# Patient Record
Sex: Male | Born: 1960 | Race: Asian | Hispanic: No | Marital: Married | Smoking: Never smoker
Health system: Southern US, Community
[De-identification: ages and names within clinical notes are randomized; demographics above are authoritative.]

---

## 2019-05-12 ENCOUNTER — Ambulatory Visit (INDEPENDENT_AMBULATORY_CARE_PROVIDER_SITE_OTHER): Payer: BC Managed Care – PPO | Admitting: Family Medicine

## 2019-05-12 ENCOUNTER — Encounter: Payer: Self-pay | Admitting: Family Medicine

## 2019-05-12 ENCOUNTER — Ambulatory Visit: Payer: Self-pay

## 2019-05-12 ENCOUNTER — Other Ambulatory Visit: Payer: Self-pay

## 2019-05-12 VITALS — BP 116/78 | HR 89 | Ht 71.0 in | Wt 192.0 lb

## 2019-05-12 DIAGNOSIS — M25511 Pain in right shoulder: Secondary | ICD-10-CM | POA: Diagnosis not present

## 2019-05-12 DIAGNOSIS — G8929 Other chronic pain: Secondary | ICD-10-CM | POA: Insufficient documentation

## 2019-05-12 DIAGNOSIS — M25512 Pain in left shoulder: Secondary | ICD-10-CM

## 2019-05-12 NOTE — Progress Notes (Signed)
Blake Mcdowell - 58 y.o. male MRN [id number]  Date of birth: 1961/09/22  SUBJECTIVE:  Including CC & ROS.  Chief Complaint  Patient presents with  . Shoulder Pain    right shoulder    Blake Mcdowell is a 58 y.o. male that is presenting with bilateral shoulder pain.  The pain is generalized over the left and right shoulder.  It is localized to the shoulder joint itself.  He feels the pain with certain movements such as lifting above his head.  He works at [employer].  He reports having improvement with the dexamethasone.  He denies any specific inciting event or trauma.  The pain is occurring on a daily basis now.  It has been occurring since January.  He reports the pain is moderate to severe.  Denies any history of surgery.  There is no radicular symptoms.  The pain can be sharp and stabbing.  Here denies a history of gout.  Review of his office note from 5/21 shows a shoulder x-ray on the right that a was read as normal.  He was prescribed dexamethasone.   Review of Systems  Constitutional: Negative for fever.  HENT: Negative for congestion.   Respiratory: Negative for cough.   Cardiovascular: Negative for chest pain.  Gastrointestinal: Negative for abdominal pain.  Musculoskeletal: Positive for arthralgias.  Skin: Negative for color change.  Neurological: Negative for weakness.  Hematological: Negative for adenopathy.    HISTORY: Past Medical, Surgical, Social, and Family History Reviewed & Updated per EMR.   Pertinent Historical Findings include:  History reviewed. No pertinent past medical history.  History reviewed. No pertinent surgical history.  Allergies  Allergen Reactions  . Aspirin     History reviewed. No pertinent family history.   Social History   Socioeconomic History  . Marital status: Married    Spouse name: Not on file  . Number of children: Not on file  . Years of education: Not on file  . Highest education level: Not on file  Occupational History  . Not on file   Social Needs  . Financial resource strain: Not on file  . Food insecurity:    Worry: Not on file    Inability: Not on file  . Transportation needs:    Medical: Not on file    Non-medical: Not on file  Tobacco Use  . Smoking status: Never Smoker  . Smokeless tobacco: Never Used  Substance and Sexual Activity  . Alcohol use: Not on file  . Drug use: Not on file  . Sexual activity: Not on file  Lifestyle  . Physical activity:    Days per week: Not on file    Minutes per session: Not on file  . Stress: Not on file  Relationships  . Social connections:    Talks on phone: Not on file    Gets together: Not on file    Attends religious service: Not on file    Active member of club or organization: Not on file    Attends meetings of clubs or organizations: Not on file    Relationship status: Not on file  . Intimate partner violence:    Fear of current or ex partner: Not on file    Emotionally abused: Not on file    Physically abused: Not on file    Forced sexual activity: Not on file  Other Topics Concern  . Not on file  Social History Narrative  . Not on file     PHYSICAL  EXAM:  VS: BP 116/78   Pulse 89   Ht 5\' 11"  (1.803 m)   Wt 192 lb (87.1 kg)   BMI 26.78 kg/m  Physical Exam Gen: NAD, alert, cooperative with exam, well-appearing ENT: normal lips, normal nasal mucosa,  Eye: normal EOM, normal conjunctiva and lids CV:  no edema, +2 pedal pulses   Resp: no accessory muscle use, non-labored,  Skin: no rashes, no areas of induration  Neuro: normal tone, normal sensation to touch Psych:  normal insight, alert and oriented MSK:  Right and left Shoulder: Inspection reveals no abnormalities, atrophy or asymmetry. Palpation is normal with no tenderness over AC joint Normal ER  Pain with abduction and ER  Rotator cuff strength normal throughout. Mild pain with Hawkin's tests, empty can sign. Normal scapular function observed. Neurovascularly intact   Limited  ultrasound: Right and left shoulder:  Right shoulder:  Encircling effusion of the biceps tendon in the trans-and long axis. Normal-appearing subscapularis. Normal-appearing supraspinatus. There appears to be some crystalline deposition within the posterior glenohumeral joint.  Left shoulder: Encircling effusion of the biceps tendon in long axis with hyperechoic lucencies to suggest gout.   Summary: Findings are suggestive of gouty change with an effusion of the biceps tendon bilaterally.  Ultrasound and interpretation by Clare Gandy, MD          ASSESSMENT & PLAN:   No problem-specific Assessment & Plan notes found for this encounter.

## 2019-05-12 NOTE — Assessment & Plan Note (Signed)
It appears that he has a gout flare in each shoulder.  There was an encircling effusion around the biceps tendon in the left and right shoulder.  There appears to be hyperechoic crystals seen within this effusion.  X-ray from his primary care was reported as normal in the right shoulder. - prednisone  - f/u in 2-3 weeks. Would obtain uric acid

## 2019-05-12 NOTE — Patient Instructions (Signed)
Nice to meet you Please try over the counter voltaren gel  Please try ice on the shoulder  Please try the exercises   Please send me a message in MyChart with any questions or updates.  Please see me back in 4 weeks.   --Dr. Jordan Likes

## 2019-06-13 ENCOUNTER — Ambulatory Visit: Payer: BC Managed Care – PPO | Admitting: Family Medicine

## 2019-06-14 ENCOUNTER — Ambulatory Visit (INDEPENDENT_AMBULATORY_CARE_PROVIDER_SITE_OTHER): Payer: BC Managed Care – PPO | Admitting: Family Medicine

## 2019-06-14 ENCOUNTER — Other Ambulatory Visit: Payer: Self-pay

## 2019-06-14 ENCOUNTER — Encounter: Payer: Self-pay | Admitting: Family Medicine

## 2019-06-14 VITALS — BP 109/77 | HR 97 | Ht 71.0 in | Wt 200.0 lb

## 2019-06-14 DIAGNOSIS — G8929 Other chronic pain: Secondary | ICD-10-CM | POA: Diagnosis not present

## 2019-06-14 DIAGNOSIS — M25511 Pain in right shoulder: Secondary | ICD-10-CM

## 2019-06-14 DIAGNOSIS — M25512 Pain in left shoulder: Secondary | ICD-10-CM

## 2019-06-14 MED ORDER — COLCHICINE 0.6 MG PO TABS: 0.6000 mg | ORAL_TABLET | Freq: Two times a day (BID) | ORAL | 2 refills | Status: AC

## 2019-06-14 NOTE — Patient Instructions (Signed)
Good to see you Please take the colchicine 2 times daily until you have no pain in your shoulders for 2 days. I will call you with the lab results from today.  We may need to add another medication. Please send me a message in MyChart with any questions or updates.  Please see me back in 4 weeks.   --Dr. Raeford Razor

## 2019-06-14 NOTE — Progress Notes (Signed)
Blake Mcdowell - 58 y.o. male MRN [id number]  Date of birth: February 12, 1961  SUBJECTIVE:  Including CC & ROS.  Chief Complaint  Patient presents with  . Follow-up    follow up for right shoulder    Blake Mcdowell is a 58 y.o. male that is presenting with acute worsening of bilateral shoulder pain.  Of the right is worse than the left.  He was prescribed prednisone and had improvement of his symptoms.  Prior to that he was prescribed dexamethasone.  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  The pain is worse with abduction and overhead movements.  He feels like there is popping in the shoulder.  He feels that there is some pain to the neck.  It is sharp and stabbing.  The pain is moderate to severe.  It is more constant in nature.   Review of Systems  Constitutional: Negative for fever.  HENT: Negative for congestion.   Respiratory: Negative for cough.   Cardiovascular: Negative for chest pain.  Gastrointestinal: Negative for abdominal pain.  Musculoskeletal: Positive for joint swelling.  Skin: Negative for color change.  Neurological: Negative for weakness.  Hematological: Negative for adenopathy.    HISTORY: Past Medical, Surgical, Social, and Family History Reviewed & Updated per EMR.   Pertinent Historical Findings include:  No past medical history on file.  No past surgical history on file.  Allergies  Allergen Reactions  . Aspirin     No family history on file.   Social History   Socioeconomic History  . Marital status: Married    Spouse name: Not on file  . Number of children: Not on file  . Years of education: Not on file  . Highest education level: Not on file  Occupational History  . Not on file  Social Needs  . Financial resource strain: Not on file  . Food insecurity    Worry: Not on file    Inability: Not on file  . Transportation needs    Medical: Not on file    Non-medical: Not on file  Tobacco Use  . Smoking status: Never Smoker  .  Smokeless tobacco: Never Used  Substance and Sexual Activity  . Alcohol use: Not on file  . Drug use: Not on file  . Sexual activity: Not on file  Lifestyle  . Physical activity    Days per week: Not on file    Minutes per session: Not on file  . Stress: Not on file  Relationships  . Social Musicianconnections    Talks on phone: Not on file    Gets together: Not on file    Attends religious service: Not on file    Active member of club or organization: Not on file    Attends meetings of clubs or organizations: Not on file    Relationship status: Not on file  . Intimate partner violence    Fear of current or ex partner: Not on file    Emotionally abused: Not on file    Physically abused: Not on file    Forced sexual activity: Not on file  Other Topics Concern  . Not on file  Social History Narrative  . Not on file     PHYSICAL EXAM:  VS: BP 109/77   Pulse 97   Ht 5\' 11"  (1.803 m)   Wt 200 lb (90.7 kg)   BMI 27.89 kg/m  Physical Exam Gen: NAD, alert, cooperative with exam, well-appearing ENT: normal  lips, normal nasal mucosa,  Eye: normal EOM, normal conjunctiva and lids CV:  no edema, +2 pedal pulses   Resp: no accessory muscle use, non-labored,  Skin: no rashes, no areas of induration  Neuro: normal tone, normal sensation to touch Psych:  normal insight, alert and oriented MSK:  Right and left shoulder:  Normal active flexion and abduction  Pain with ER  Normal IR and ER  Positive O'Brien's test. Neurovascular intact     ASSESSMENT & PLAN:   Chronic pain of both shoulders Still having ongoing effusions within the left and right shoulder.  Pain seems more joint oriented.  Still likely related to gout. -Uric acid and CMP. -Colchicine. -If no improvement will consider bilateral injections.  Will consider a rheumatology work-up as well.

## 2019-06-15 LAB — URIC ACID: Uric Acid: 5.7 mg/dL (ref 3.7–8.6)

## 2019-06-15 LAB — COMPREHENSIVE METABOLIC PANEL
ALT: 30 IU/L (ref 0–44)
AST: 19 IU/L (ref 0–40)
Albumin/Globulin Ratio: 1.8 (ref 1.2–2.2)
Albumin: 4.4 g/dL (ref 3.8–4.9)
Alkaline Phosphatase: 61 IU/L (ref 39–117)
BUN/Creatinine Ratio: 27 — ABNORMAL HIGH (ref 9–20)
BUN: 20 mg/dL (ref 6–24)
Bilirubin Total: 0.5 mg/dL (ref 0.0–1.2)
CO2: 20 mmol/L (ref 20–29)
Calcium: 9.4 mg/dL (ref 8.7–10.2)
Chloride: 101 mmol/L (ref 96–106)
Creatinine, Ser: 0.74 mg/dL — ABNORMAL LOW (ref 0.76–1.27)
GFR calc Af Amer: 119 mL/min/{1.73_m2} (ref >59)
GFR calc non Af Amer: 103 mL/min/{1.73_m2} (ref >59)
Globulin, Total: 2.4 g/dL (ref 1.5–4.5)
Glucose: 236 mg/dL — ABNORMAL HIGH (ref 65–99)
Potassium: 4.2 mmol/L (ref 3.5–5.2)
Sodium: 136 mmol/L (ref 134–144)
Total Protein: 6.8 g/dL (ref 6.0–8.5)

## 2019-06-15 NOTE — Assessment & Plan Note (Signed)
Still having ongoing effusions within the left and right shoulder.  Pain seems more joint oriented.  Still likely related to gout. -Uric acid and CMP. -Colchicine. -If no improvement will consider bilateral injections.  Will consider a rheumatology work-up as well.

## 2019-06-16 ENCOUNTER — Telehealth: Payer: Self-pay | Admitting: Family Medicine

## 2019-06-16 NOTE — Telephone Encounter (Signed)
Spoke with patient's daughter about results.   Rosemarie Ax, MD [facility] 06/16/2019, 12:17 PM

## 2019-06-20 ENCOUNTER — Telehealth: Payer: Self-pay | Admitting: Family Medicine

## 2019-06-20 NOTE — Telephone Encounter (Signed)
Patient's called to update that the medication has not helped. She requested a call back.

## 2019-06-21 NOTE — Telephone Encounter (Signed)
Left VM for patient. If they calls back please have them speak with a nurse/CMA and inform we need to obtain further lab work and will need to inject both of his shoulders.   If any questions then please take the best time and phone number to call and I will try to call them back.   Rosemarie Ax, MD [facility] 06/21/2019, 8:52 AM

## 2019-06-22 ENCOUNTER — Other Ambulatory Visit: Payer: Self-pay

## 2019-06-22 ENCOUNTER — Ambulatory Visit: Payer: Self-pay

## 2019-06-22 ENCOUNTER — Ambulatory Visit (INDEPENDENT_AMBULATORY_CARE_PROVIDER_SITE_OTHER): Payer: BC Managed Care – PPO | Admitting: Family Medicine

## 2019-06-22 ENCOUNTER — Encounter: Payer: Self-pay | Admitting: Family Medicine

## 2019-06-22 ENCOUNTER — Ambulatory Visit (HOSPITAL_BASED_OUTPATIENT_CLINIC_OR_DEPARTMENT_OTHER)
Admission: RE | Admit: 2019-06-22 | Discharge: 2019-06-22 | Disposition: A | Discharge status: Home / Self Care | Payer: BC Managed Care – PPO | Source: Ambulatory Visit | Attending: Family Medicine | Admitting: Family Medicine

## 2019-06-22 VITALS — BP 124/76 | HR 83 | Ht 71.0 in | Wt 209.0 lb

## 2019-06-22 DIAGNOSIS — M25511 Pain in right shoulder: Secondary | ICD-10-CM

## 2019-06-22 DIAGNOSIS — M25512 Pain in left shoulder: Secondary | ICD-10-CM

## 2019-06-22 DIAGNOSIS — G8929 Other chronic pain: Secondary | ICD-10-CM | POA: Insufficient documentation

## 2019-06-22 MED ORDER — TRIAMCINOLONE ACETONIDE 40 MG/ML IJ SUSP
40.0000 mg | Freq: Once | INTRAMUSCULAR | Status: AC
  Administered 2019-06-22: 12:00:00 40 mg via INTRA_ARTICULAR

## 2019-06-22 NOTE — Progress Notes (Signed)
Blake Mcdowell - 58 y.o. male MRN [id number]  Date of birth: 12/27/1960  SUBJECTIVE:  Including CC & ROS.  Chief Complaint  Patient presents with  . Follow-up    follow up for bilateral shoulder    Blake Mcdowell is a 58 y.o. male that is presenting for bilateral shoulder pain.  He has not had any improvement with colchicine.  He did have improvement with the steroids.   Review of Systems  HISTORY: Past Medical, Surgical, Social, and Family History Reviewed & Updated per EMR.   Pertinent Historical Findings include:  No past medical history on file.  No past surgical history on file.  Allergies  Allergen Reactions  . Aspirin     No family history on file.   Social History   Socioeconomic History  . Marital status: Married    Spouse name: Not on file  . Number of children: Not on file  . Years of education: Not on file  . Highest education level: Not on file  Occupational History  . Not on file  Social Needs  . Financial resource strain: Not on file  . Food insecurity    Worry: Not on file    Inability: Not on file  . Transportation needs    Medical: Not on file    Non-medical: Not on file  Tobacco Use  . Smoking status: Never Smoker  . Smokeless tobacco: Never Used  Substance and Sexual Activity  . Alcohol use: Not on file  . Drug use: Not on file  . Sexual activity: Not on file  Lifestyle  . Physical activity    Days per week: Not on file    Minutes per session: Not on file  . Stress: Not on file  Relationships  . Social Herbalist on phone: Not on file    Gets together: Not on file    Attends religious service: Not on file    Active member of club or organization: Not on file    Attends meetings of clubs or organizations: Not on file    Relationship status: Not on file  . Intimate partner violence    Fear of current or ex partner: Not on file    Emotionally abused: Not on file    Physically abused: Not on file    Forced sexual activity: Not on file   Other Topics Concern  . Not on file  Social History Narrative  . Not on file     PHYSICAL EXAM:  VS: BP 124/76   Pulse 83   Ht _0  (1.803 m)   Wt 209 lb (94.8 kg)   BMI 29.15 kg/m  Physical Exam Gen: NAD, alert, cooperative with exam, well-appearing   Aspiration/Injection Procedure Note Amadeo Lutterman 09-24-1961  Procedure: Injection Indications: Right shoulder pain  Procedure Details Consent: Risks of procedure as well as the alternatives and risks of each were explained to the (patient/caregiver).  Consent for procedure obtained. Time Out: Verified patient identification, verified procedure, site/side was marked, verified correct patient position, special equipment/implants available, medications/allergies/relevent history reviewed, required imaging and test results available.  Performed.  The area was cleaned with iodine and alcohol swabs.    The right glenohumeral joint was injected using 1 cc's of 40 mg Kenalog and 4 cc's of 0.25% bupivacaine with a 22 3 1/2" needle.  Ultrasound was used. Images were obtained in trans-views showing the injection.     A sterile dressing was applied.  Patient did tolerate procedure  well.   Aspiration/Injection Procedure Note Jacobo Fier [birth year]  Procedure: Injection Indications: Left shoulder pain  Procedure Details Consent: Risks of procedure as well as the alternatives and risks of each were explained to the (patient/caregiver).  Consent for procedure obtained. Time Out: Verified patient identification, verified procedure, site/side was marked, verified correct patient position, special equipment/implants available, medications/allergies/relevent history reviewed, required imaging and test results available.  Performed.  The area was cleaned with iodine and alcohol swabs.    The left glenohumeral joint was injected using 1 cc's of 40 mg Kenalog and 4 cc's of 0.25% bupivacaine with a 22 3 1/2" needle.  Ultrasound was used. Images were  obtained in trans-views showing the injection.     A sterile dressing was applied.  Patient did tolerate procedure well.       ASSESSMENT & PLAN:   Chronic pain of both shoulders Has had improvement of his symptoms with dexamethasone and prednisone.  Did not have any improvement with colchicine.  Does not seem to be related to gout.  May be related to labral pathology or other rheumatologic condition. -Bilateral injections today. -Bilateral x-rays. -If no improvement need to consider ESR, CRP, RA, anti-CCP, ANA, CBC, parvo B19, hep B, hep C

## 2019-06-22 NOTE — Patient Instructions (Signed)
Good to see you I will call you with the results from today  Please try the exercises   Please send me a message in MyChart with any questions or updates.  Please see me back in 4 weeks.   --Dr. Raeford Razor

## 2019-06-22 NOTE — Assessment & Plan Note (Signed)
Has had improvement of his symptoms with dexamethasone and prednisone.  Did not have any improvement with colchicine.  Does not seem to be related to gout.  May be related to labral pathology or other rheumatologic condition. -Bilateral injections today. -Bilateral x-rays. -If no improvement need to consider ESR, CRP, RA, anti-CCP, ANA, CBC, parvo B19, hep B, hep C

## 2019-06-24 ENCOUNTER — Telehealth: Payer: Self-pay | Admitting: Family Medicine

## 2019-06-24 NOTE — Telephone Encounter (Signed)
Unable to leave VM for patient. If he calls back please have him speak with a nurse/CMA and inform that his xrays were normal.   If any questions then please take the best time and phone number to call and I will try to call him back.   Rosemarie Ax, MD [facility] 06/24/2019, 11:34 AM

## 2019-07-11 ENCOUNTER — Ambulatory Visit: Payer: BC Managed Care – PPO | Admitting: Family Medicine

## 2019-07-18 ENCOUNTER — Encounter: Payer: Self-pay | Admitting: Family Medicine

## 2019-07-18 ENCOUNTER — Ambulatory Visit (INDEPENDENT_AMBULATORY_CARE_PROVIDER_SITE_OTHER): Payer: BC Managed Care – PPO | Admitting: Family Medicine

## 2019-07-18 ENCOUNTER — Other Ambulatory Visit: Payer: Self-pay

## 2019-07-18 DIAGNOSIS — G8929 Other chronic pain: Secondary | ICD-10-CM | POA: Diagnosis not present

## 2019-07-18 DIAGNOSIS — M25512 Pain in left shoulder: Secondary | ICD-10-CM

## 2019-07-18 DIAGNOSIS — M25511 Pain in right shoulder: Secondary | ICD-10-CM | POA: Diagnosis not present

## 2019-07-18 NOTE — Patient Instructions (Signed)
Good to see you Please use ice on the shoulder if needed  We could consider a muscle relaxer  We can give steroid injections every three months   Please send me a message in MyChart with any questions or updates.  Please see me back as needed.   --Dr. Raeford Razor

## 2019-07-18 NOTE — Progress Notes (Signed)
Blake Mcdowell - 58 y.o. male MRN [id number]  Date of birth: 1961-02-09  SUBJECTIVE:  Including CC & ROS.  No chief complaint on file.   Blake Mcdowell is a 58 y.o. male that is following up for his bilateral shoulder pain.  He received steroid injections on 7/15.  Since that time he has had significant provement of his shoulder pain.  Denies any pain with work.  He is back to working full-time.  Denies any radicular symptoms.   Review of Systems  Constitutional: Negative for fever.  HENT: Negative for congestion.   Respiratory: Negative for cough.   Cardiovascular: Negative for chest pain.  Gastrointestinal: Negative for abdominal pain.  Musculoskeletal: Negative for gait problem.  Skin: Negative for color change.  Neurological: Negative for weakness.  Hematological: Negative for adenopathy.    HISTORY: Past Medical, Surgical, Social, and Family History Reviewed & Updated per EMR.   Pertinent Historical Findings include:  No past medical history on file.  No past surgical history on file.  Allergies  Allergen Reactions  . Aspirin     No family history on file.   Social History   Socioeconomic History  . Marital status: Married    Spouse name: Not on file  . Number of children: Not on file  . Years of education: Not on file  . Highest education level: Not on file  Occupational History  . Not on file  Social Needs  . Financial resource strain: Not on file  . Food insecurity    Worry: Not on file    Inability: Not on file  . Transportation needs    Medical: Not on file    Non-medical: Not on file  Tobacco Use  . Smoking status: Never Smoker  . Smokeless tobacco: Never Used  Substance and Sexual Activity  . Alcohol use: Not on file  . Drug use: Not on file  . Sexual activity: Not on file  Lifestyle  . Physical activity    Days per week: Not on file    Minutes per session: Not on file  . Stress: Not on file  Relationships  . Social Musicianconnections    Talks on phone: Not on  file    Gets together: Not on file    Attends religious service: Not on file    Active member of club or organization: Not on file    Attends meetings of clubs or organizations: Not on file    Relationship status: Not on file  . Intimate partner violence    Fear of current or ex partner: Not on file    Emotionally abused: Not on file    Physically abused: Not on file    Forced sexual activity: Not on file  Other Topics Concern  . Not on file  Social History Narrative  . Not on file     PHYSICAL EXAM:  VS: BP 116/82   Pulse 77   Ht 5\' 11"  (1.803 m)   Wt 201 lb (91.2 kg)   BMI 28.03 kg/m  Physical Exam Gen: NAD, alert, cooperative with exam, well-appearing ENT: normal lips, normal nasal mucosa,  Eye: normal EOM, normal conjunctiva and lids CV:  no edema, +2 pedal pulses   Resp: no accessory muscle use, non-labored,  Skin: no rashes, no areas of induration  Neuro: normal tone, normal sensation to touch Psych:  normal insight, alert and oriented MSK:  Right and left shoulder: Normal range of motion. Normal external rotation. Negative empty can testing. Neurovascular  intact     ASSESSMENT & PLAN:   Chronic pain of both shoulders Has had significant improvement since his last injections.  Unclear if this is related to degenerative changes in the joint or labral issues.  Did have an effusion some may need to consider a rheumatologic work-up.  Less likely for gout at this time. -Counseled on home exercise therapy and supportive care. -May need to consider MRI if recurrence.

## 2019-07-18 NOTE — Assessment & Plan Note (Signed)
Has had significant improvement since his last injections.  Unclear if this is related to degenerative changes in the joint or labral issues.  Did have an effusion some may need to consider a rheumatologic work-up.  Less likely for gout at this time. -Counseled on home exercise therapy and supportive care. -May need to consider MRI if recurrence.

## 2019-08-01 ENCOUNTER — Other Ambulatory Visit: Payer: Self-pay | Admitting: Family Medicine

## 2021-03-25 IMAGING — CR RIGHT SHOULDER - 2+ VIEW
3 series · 3 of 3 positions shown · non-contrast
Comparison: None.

CLINICAL DATA: Right shoulder pain for several months. No specific
injury.

EXAM:
RIGHT SHOULDER - 2+ VIEW

[w shoulder grashey right]
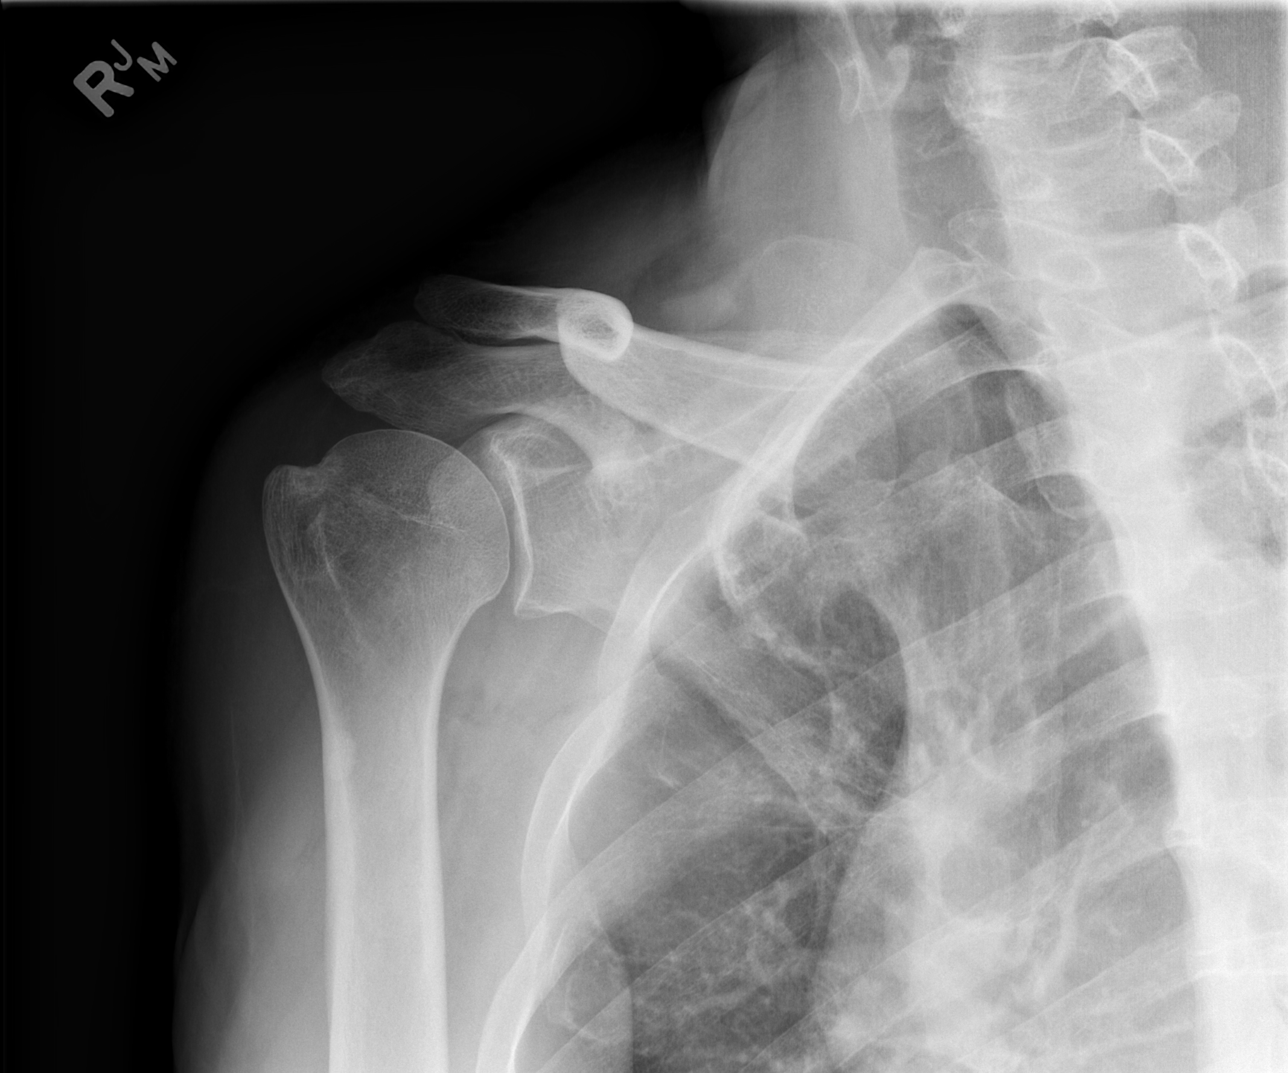

[w shoulder y view right]
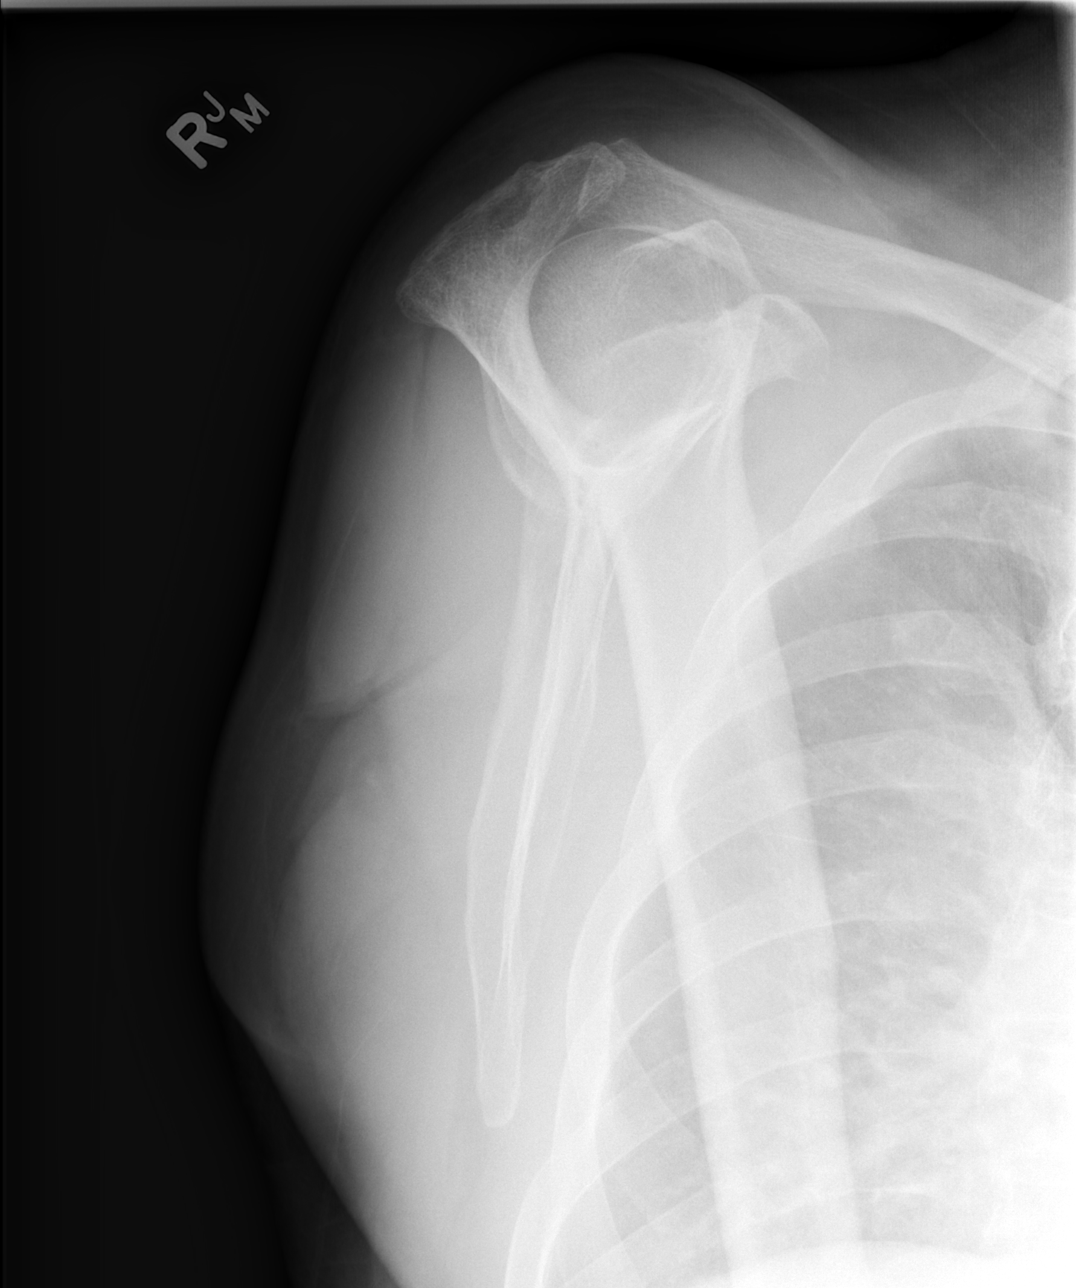

[w shoulder axillary right *]
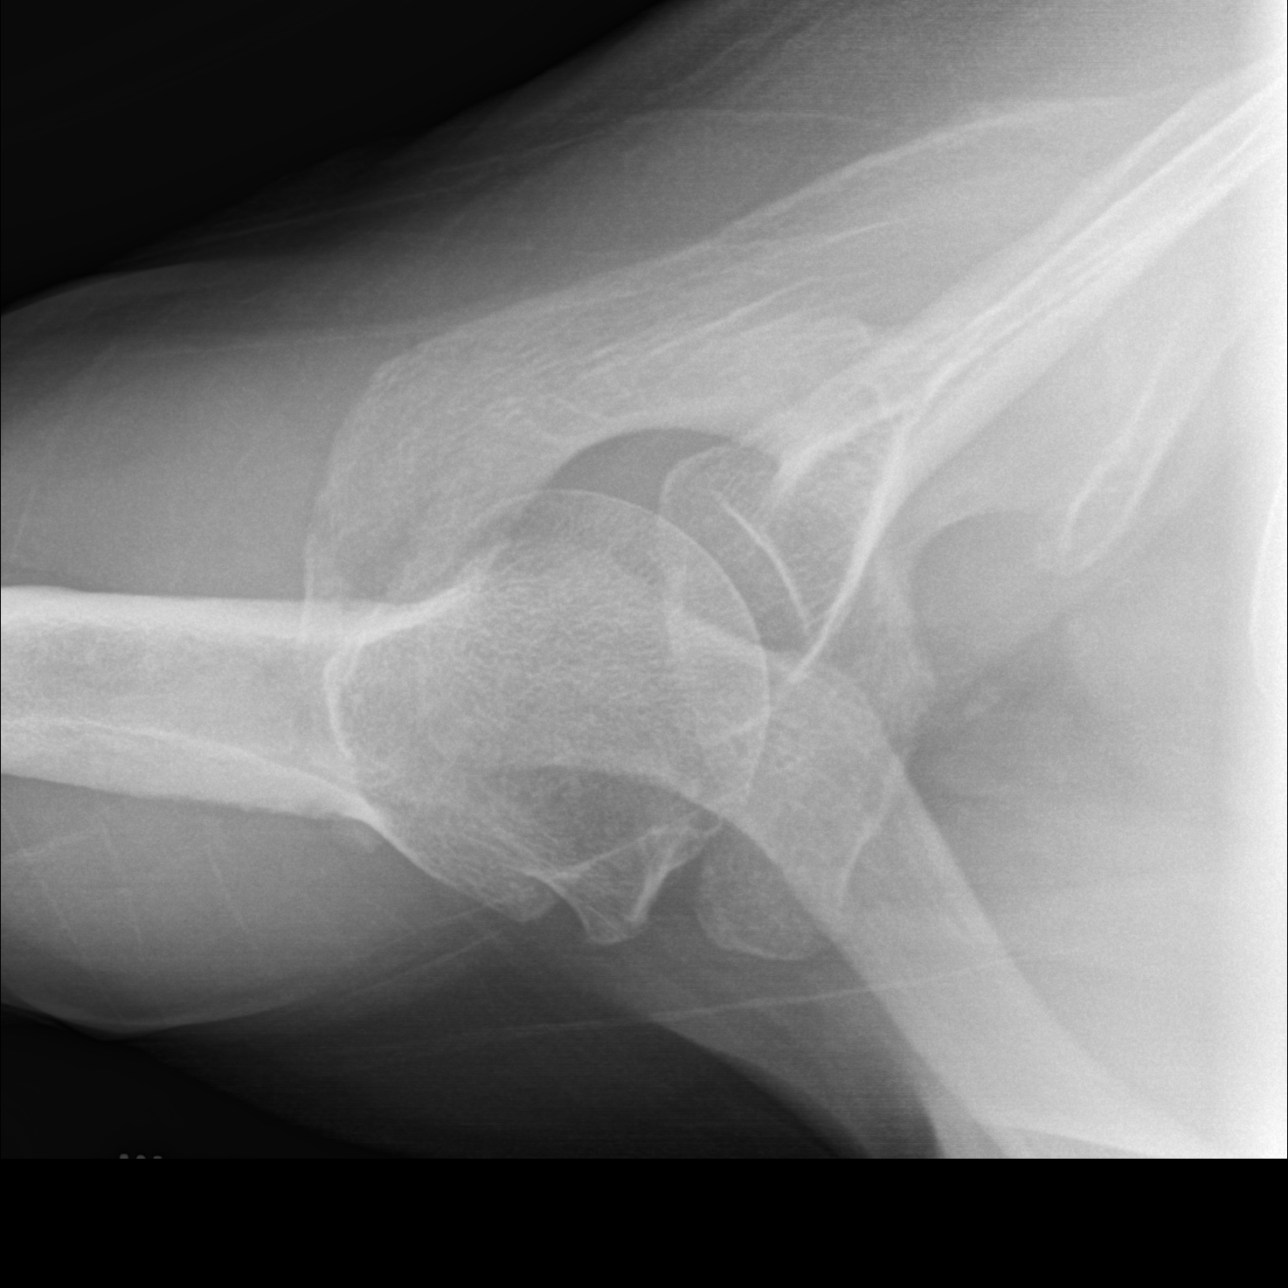

[3 of 3 positions shown; findings below may reference images not displayed]

FINDINGS: The joint spaces are maintained. No acute bony findings or bone
lesion. No abnormal soft tissue calcifications. The visualized lung
is clear and the visualized ribs are intact.
IMPRESSION: No acute bony findings or significant degenerative changes.

## 2021-03-25 IMAGING — CR LEFT SHOULDER - 2+ VIEW
3 series · 3 of 3 positions shown · non-contrast
Comparison: None.

CLINICAL DATA: Left shoulder pain for several months. No known
injury.

EXAM:
LEFT SHOULDER - 2+ VIEW

[w shoulder grashey left]
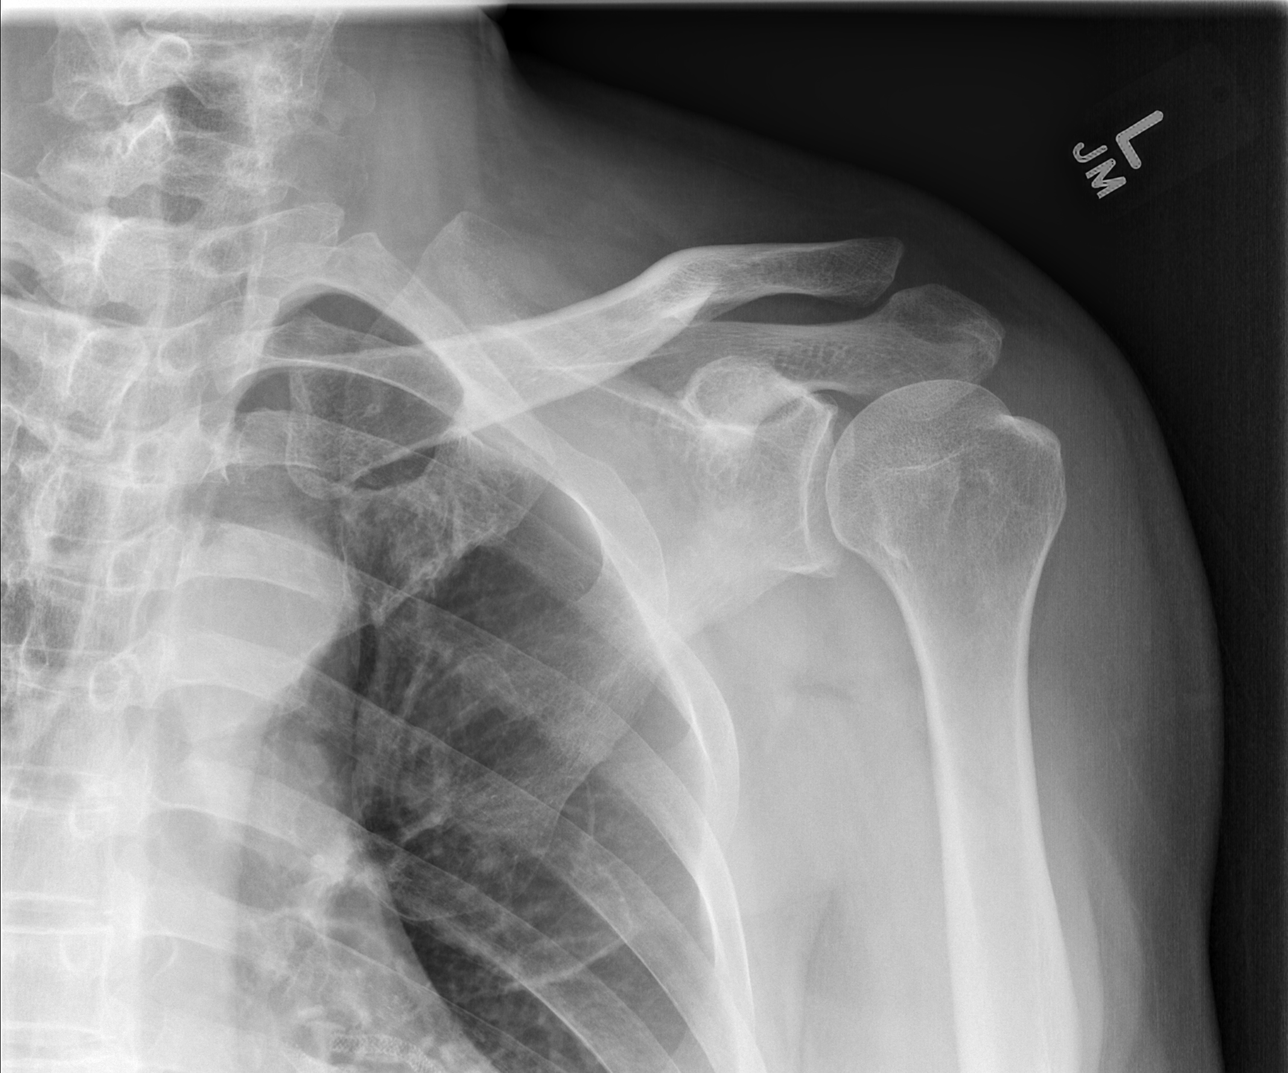

[w shoulder y view left]
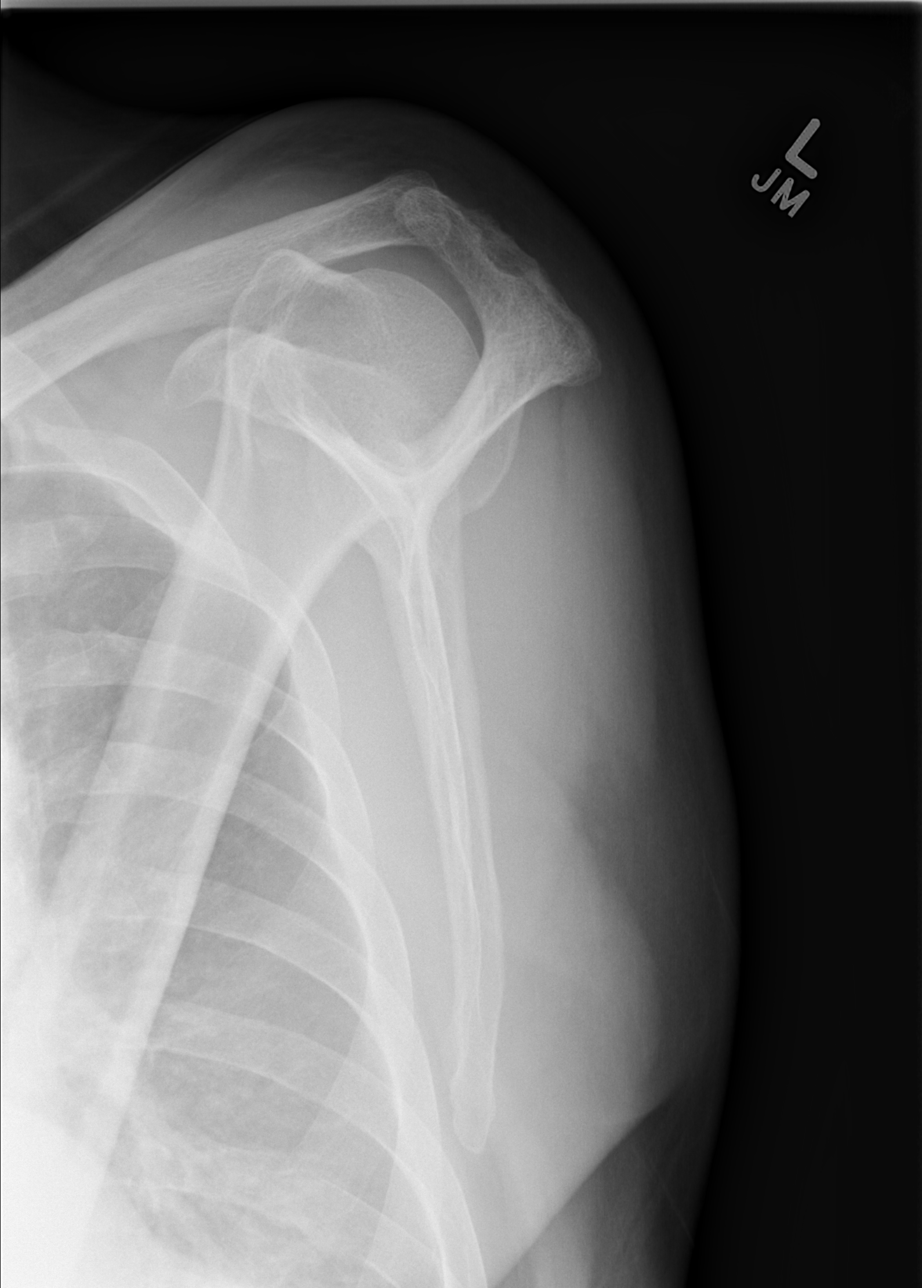

[w shoulder axillary left *]
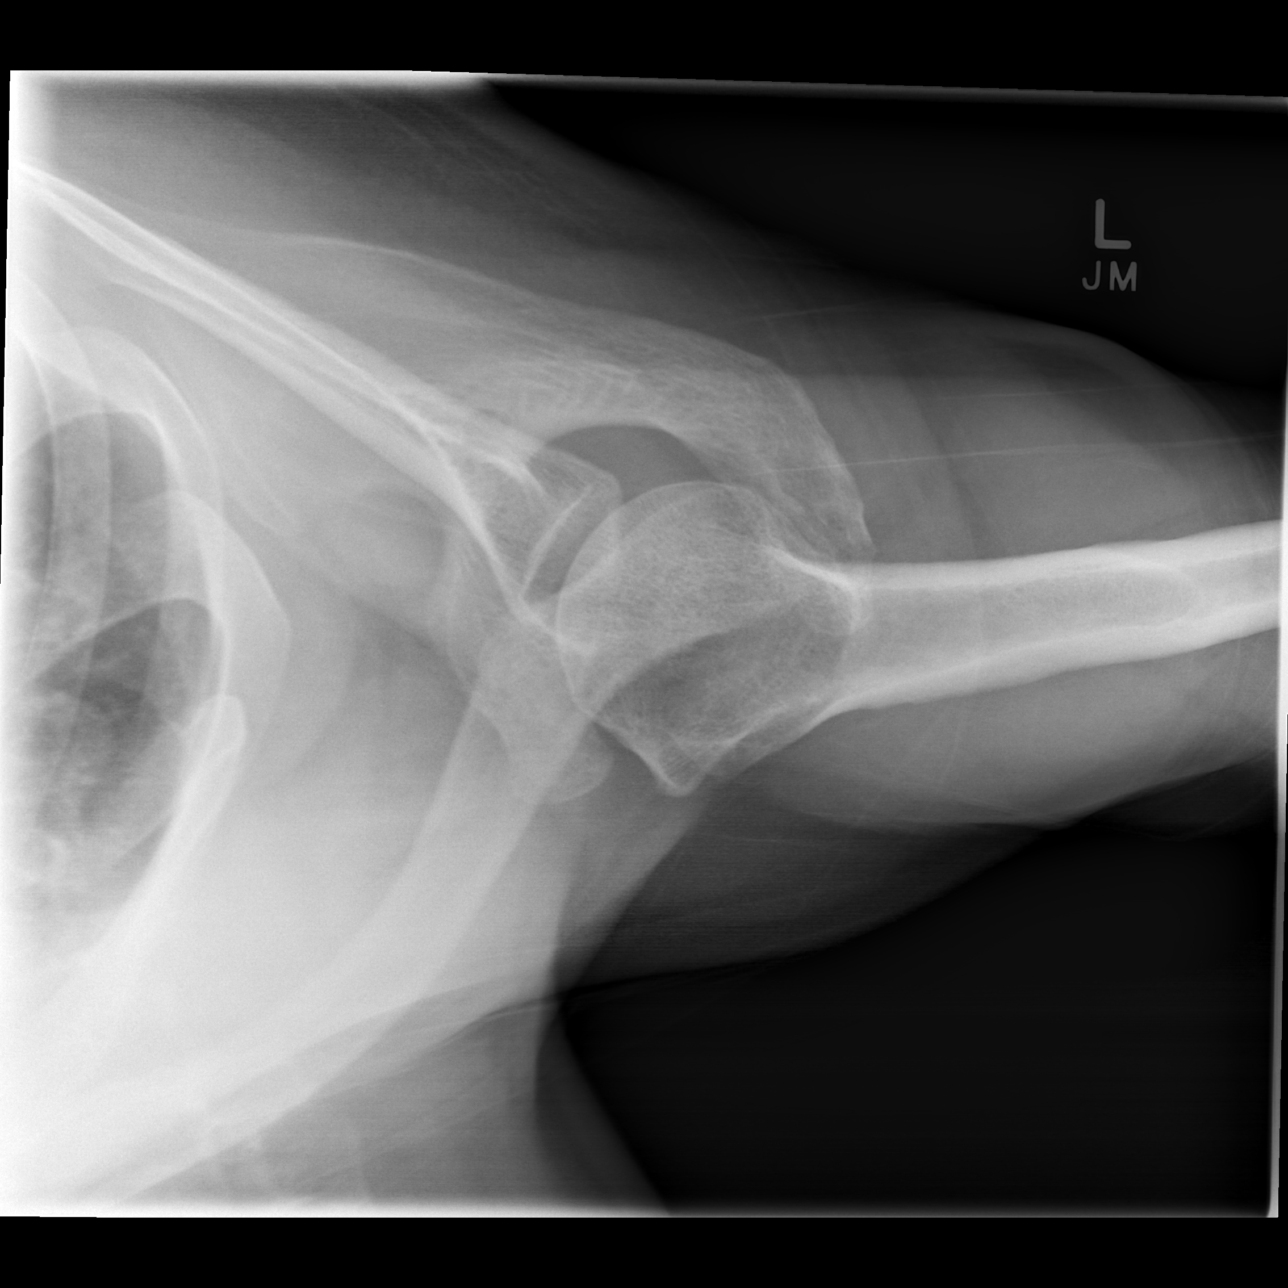

[3 of 3 positions shown; findings below may reference images not displayed]

FINDINGS: The joint spaces are maintained. No acute bony findings or bone
lesion. No abnormal soft tissue calcifications. The visualized lung
is clear and the visualized ribs are intact.
IMPRESSION: No acute bony findings or degenerative changes.

## 2023-03-24 ENCOUNTER — Encounter: Payer: Self-pay | Admitting: *Deleted
